# Patient Record
Sex: Male | Born: 1950 | Race: White | Hispanic: No | Marital: Married
Health system: Southern US, Community
[De-identification: ages and names within clinical notes are randomized; demographics above are authoritative.]

---

## 2006-09-10 ENCOUNTER — Encounter: Payer: Self-pay | Admitting: Cardiology

## 2006-09-11 ENCOUNTER — Ambulatory Visit: Payer: Self-pay | Admitting: Cardiology

## 2006-09-11 ENCOUNTER — Inpatient Hospital Stay (HOSPITAL_COMMUNITY): Admission: AD | Admit: 2006-09-11 | Discharge: 2006-09-13 | Payer: Self-pay | Admitting: Cardiovascular Disease

## 2006-10-01 ENCOUNTER — Ambulatory Visit: Payer: Self-pay | Admitting: Cardiovascular Disease

## 2006-12-30 ENCOUNTER — Ambulatory Visit: Payer: Self-pay

## 2006-12-30 ENCOUNTER — Encounter: Payer: Self-pay | Admitting: Cardiovascular Disease

## 2006-12-30 ENCOUNTER — Ambulatory Visit: Payer: Self-pay | Admitting: Cardiovascular Disease

## 2006-12-30 LAB — CONVERTED CEMR LAB
Bilirubin, Direct: 0.1 mg/dL (ref 0.0–0.3)
HDL: 28.2 mg/dL — ABNORMAL LOW (ref >39.0)
LDL Cholesterol: 65 mg/dL (ref 0–99)
Triglycerides: 96 mg/dL (ref 0–149)
VLDL: 19 mg/dL (ref 0–40)

## 2007-01-02 ENCOUNTER — Ambulatory Visit: Payer: Self-pay | Admitting: Cardiovascular Disease

## 2008-06-30 IMAGING — CR DG CHEST 1V PORT
1 series · 1 of 1 positions shown · non-contrast
Comparison: 09/10/06.

CLINICAL DATA: 56-year-old, CHF.  
 PORTABLE CHEST - 1 VIEW:

[view not recorded]
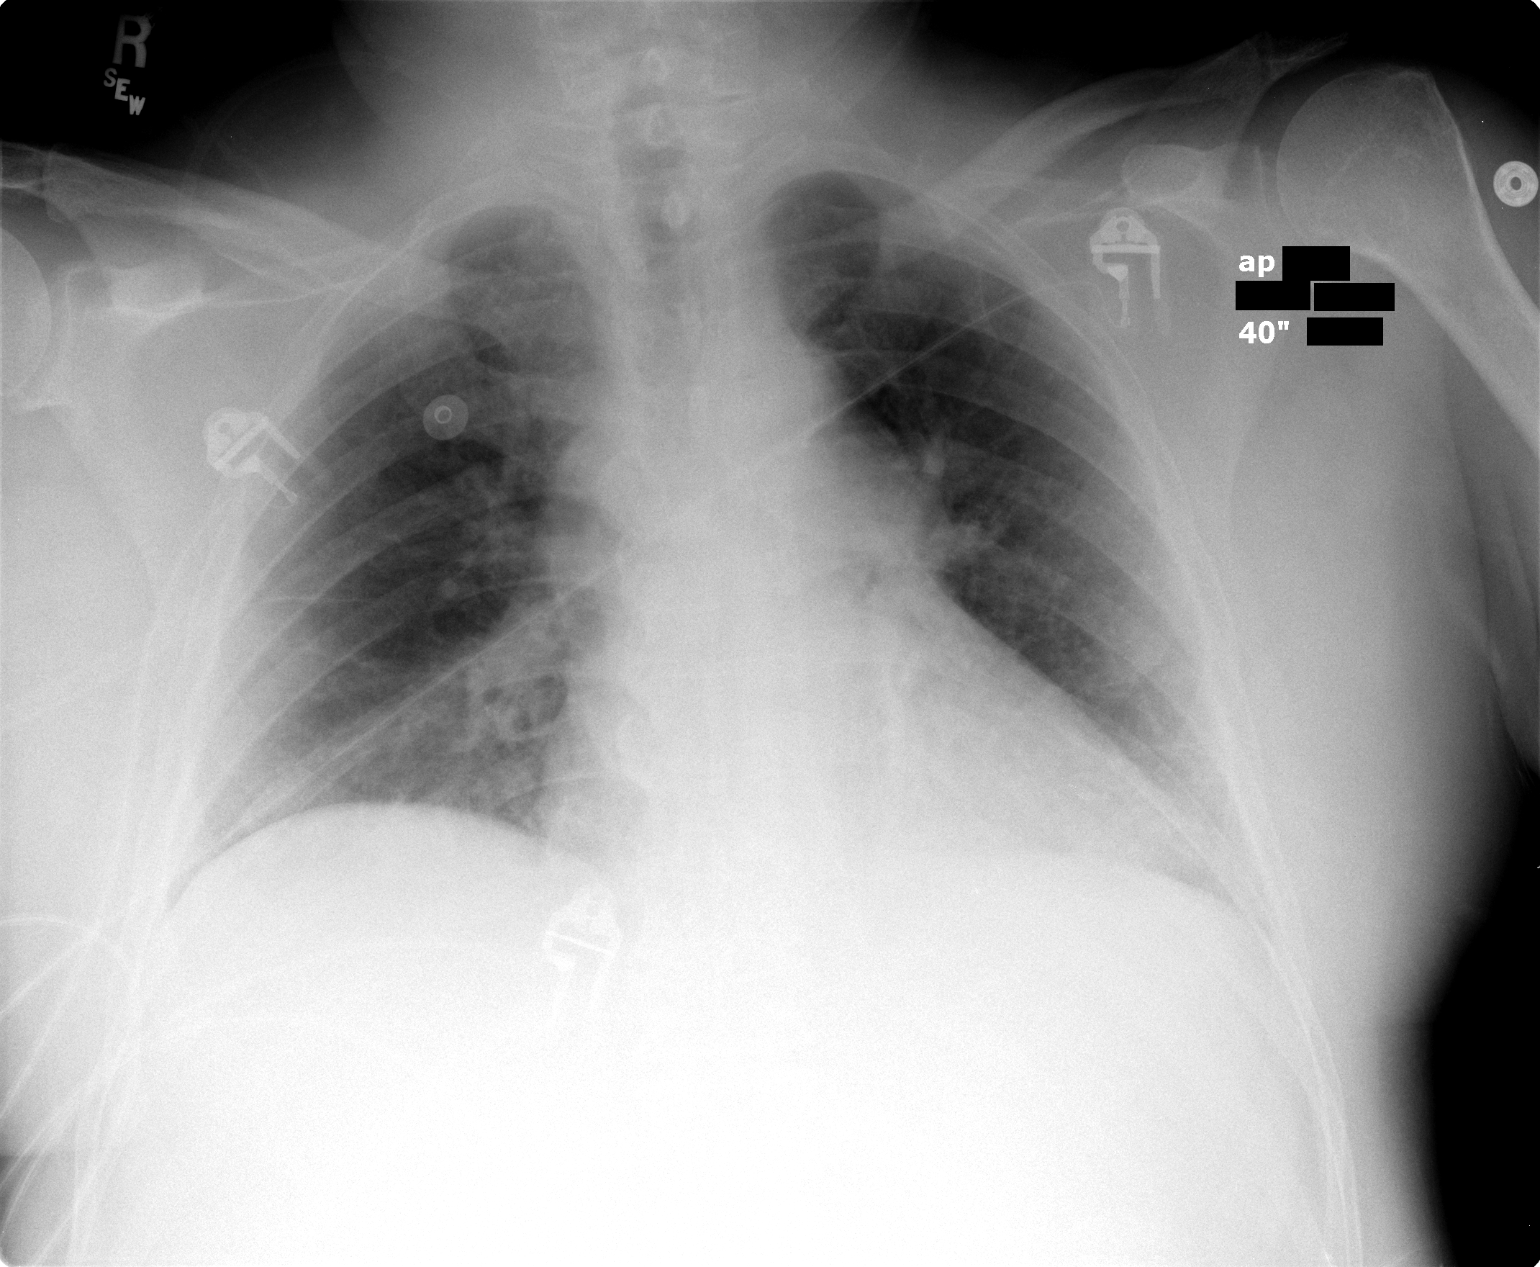

[1 of 1 positions shown; findings below may reference images not displayed]

FINDINGS: Heart remains enlarged.  Mild vascular congestion, but no pulmonary edema.  Prominent pulmonary hila.  Streaky bibasilar atelectasis.
IMPRESSION: Overall stable chest x-ray.  Slight increase in bibasilar atelectasis.

## 2009-08-23 DEATH — deceased

## 2010-11-07 NOTE — Assessment & Plan Note (Signed)
[facility]                            CARDIOLOGY OFFICE NOTE   NAME:Luis Cook                        MRN:          [id number]  DATE:01/02/2007                            DOB:          Jul 27, 1950    Luis Cook was seen in followup at the [facility] Cardiology office on  January 02, 2007.  He is a 60 year old gentleman with ischemic heart  disease.  He presented in March with a large inferior wall MI.  His  presentation was late in the course and despite being treated with  primary PCI, he has sustained significant LV dysfunction.  His EF was  recently evaluated by echocardiography and was 25% to 35%.  His entire  inferior wall was akinetic and there was severe hypokinesis of the  anteroseptum as well.  There was also severe hypokinesis of the apex.  Pulmonary artery pressure estimates were mildly elevated as well.  Mr.  Cook does mention that he has developed a cough.  His cough is worse  when he is outside but he has noticed a chronic cough since his  hospitalization in March.   From a symptomatic standpoint he is doing relatively well.  He has not  had any chest discomfort.  His exercise tolerance is diminished from the  time prior to his infarct; but however, he is still able work in the  yard and perform routine daily activities.  Unfortunately he continues  to smoke cigarettes.  His nausea has cleared up and he has had no  lightheadedness, syncope or other complaints.  He denies orthopnea or  PND.  He has had no edema.   MEDICATIONS:  Include:  1. Aspirin 81 mg daily.  2. Plavix 75 mg daily.  3. Lipitor 80 mg at bedtime.  4. Lisinopril 20 mg daily.  5. Glipizide 10 mg daily.  6. Citalopram 40 mg daily.  7. Prilosec 20 mg daily.  8. Mirtazapine 15 mg 2 at bedtime.  9. NPH insulin.  10.Regular insulin.  11.Metformin 1 gram in the morning and 500 mg in the evening.  12.Coreg 25 mg twice daily.   ALLERGIES:  Include CODEINE and  MORPHINE.   PHYSICAL EXAMINATION:  He is alert and oriented and in no acute  distress.  Weight is 199 pounds.  Blood pressure is 130/60.  Heart rate is 100.  Respiratory rate 16.  HEENT:  Normal.  NECK:  Normal carotid upstrokes without bruits.  Jugular venous pressure  is normal.  LUNGS:  Clear to auscultation bilaterally.  HEART:  Distant heart sounds.  Regular rate and rhythm, no murmurs or  gallops.  ABDOMEN:  Soft, nontender.  No organomegaly.  EXTREMITIES:  No clubbing, cyanosis or edema. Peripheral pulses are 2+  and equal throughout.   Lipids reviewed, total cholesterol is 112, HDL is 28, LDL 65, LFTs are  normal.   ASSESSMENT:  Luis Cook is currently stable from a cardiovascular  standpoint.  His cardiac problems are as follows:  1. Severe ischemic cardiomyopathy.  He should continue on his current      therapy.  I am  going to change his ACE inhibitor to an angiotensin      receptor blocker in the setting of his cough.  His lisinopril will      be discontinued and he will be started on Atacand 8 mg daily. He      should maintain on aspirin, clopidogrel, statin therapy and      treatment of his diabetes.  He is on a goal dose of Coreg.  I have      discussed implantable cardioverter-defibrillator therapy for      primary prevention in setting of his ischemic cardiomyopathy.  I      discussed this at length today with Luis Cook and his wife and      they would like to proceed with an evaluation.  They have some      concerns about cost issues and he has been seen at the [place] as well.      I have asked that he see either Dr. Graciela Husbands or Dr. Ladona Ridgel in      consultation and we can further work out whether his device can be      implanted here or whether he will need to have this done through      the [facility].  2. Dyslipidemia. LDL is at goal.  HDL remains low.  We will continue      with high dose Lipitor.  3. Diabetes.  Care per [name]' primary physician.  4.  Followup.  I would like to see [name] back in 3 months or      sooner if any new problems arise.  He was encouraged to discontinue      cigarettes, although he really has no desire to stop smoking.  He      was encouraged regarding increasing activity level and initiating      regular exercise.  He will notify me if he has any problems.     Veverly Fells. Excell Seltzer, MD  Electronically Signed    MDC/MedQ  DD: 01/02/2007  DT: 01/03/2007  Job #: [id number]
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
## 2010-11-10 NOTE — Assessment & Plan Note (Signed)
[facility]                            CARDIOLOGY OFFICE NOTE   NAME:Plaskett, HENERY                        MRN:          [id number]  DATE:10/01/2006                            DOB:          [birth year]    Shylo Dillenbeck is a 60 year old gentleman who presents in hospital follow-  up after a large inferior wall myocardial infarction back in mid-March  of this year.  Mr. Grill presented late into the course of an inferior  MI with residual chest pain and ST elevation.  Despite his late  presentation, with his residual symptoms, I elected to proceed with  emergency cardiac catheterization.  His catheterization demonstrated  nonobstructive disease in the LAD and left circumflex.  He had an  occluded proximal right coronary artery, which was treated with  thrombectomy, PTCA and stenting.  There was distal embolization of  plaque and no re-flow phenomenon with the coronary intervention.  At  the conclusion of the procedure there was TIMI-2 flow in the vessel.  Left ventriculogram demonstrated a large area of inferior wall akinesis  consistent with a sizeable inferior wall infarction.  The patient  actually did fairly well despite RV involvement of his inferior MI.  He  was discharged on March 21 and has done relatively well since discharge  home.  His main complaint is nausea with a few episodes of vomiting.  He  has had no chest pain, dyspnea, edema, lightheadedness, syncope or other  complaints.   CURRENT MEDICATIONS:  1. Aspirin 81 mg daily.  2. Plavix 75 mg daily.  3. Lipitor 80 mg at bedtime.  4. Coreg 6.25 mg twice daily.  5. Lisinopril 2.5 mg daily.  6. Metformin 500 mg twice daily.  7. Glipizide 10 mg daily.  8. Citalopram 40 mg daily.  9. Prilosec OTC 20 mg daily.  10.Mirtazapine 15 mg at bedtime.  11.NPH insulin and regular insulin for sliding scale as needed.   ALLERGIES:  CODEINE and MORPHINE.   PHYSICAL EXAMINATION:  GENERAL:  The  patient is alert and oriented.  He  is in no acute distress.  VITAL SIGNS:  Blood pressure is 108/72, heart rate is 106, respiratory  rate is 16, weight is 192 pounds.  HEENT:  Normal.  NECK:  Normal carotid upstrokes without bruits.  Jugular venous pressure  is normal.  LUNGS:  Clear to auscultation bilaterally.  CARDIAC:  Mild tachycardia.  No murmurs or gallops.  ABDOMEN:  Soft, nontender.  No organomegaly.  EXTREMITIES:  There is no clubbing, cyanosis, or edema.  Peripheral  pulses are 2+ and equal throughout.   EKG demonstrates normal sinus rhythm with an age-indeterminate inferior  wall infarction.   ASSESSMENT:  Mr. Rosero is doing relatively well from a cardiac  standpoint after a large inferior wall myocardial infarction with  involvement of the right ventricle.  His nausea may be related to one of  his medications, although he is on multiple medicines and it is  difficult to know which is the culprit.  His current cardiac problems  are as follows:   1. Coronary artery disease,  status post myocardial infarction.  I      would like to continue him on aspirin and clopidogrel, although it      would be acceptable for him to discontinue his clopidogrel after 30      days as it is possible that this is causing his nausea.  He was not      treated with a drug-eluting stent and in that setting,      discontinuing this medication would be reasonable.  I would like to      increase his Coreg to 12.5 mg twice daily as he continues to have      tachycardia.  His tachycardia may be related to his high caffeine      intake.  He drinks a pot of coffee daily.  This is down from his      previous consumption of 4-5 pots of coffee daily.  He should      continue on his current dose of lisinopril as his blood pressure      may not tolerate an increase in both Coreg and lisinopril today.      He should also continue on aspirin lifelong.  I would like to see      him back in approximately  10 weeks for follow-up.  2. Type 2 diabetes.  He will continue on insulin and metformin as      managed by his primary care physician.  3. Tobacco abuse.  He was a previous 2-1/2 pack per day smoker but has      cut back to one-half pack of cigarettes daily.  I encouraged him to      continue to work on this as smoking cessation would clearly be      beneficial in the setting of his coronary artery disease and      myocardial infarction.  4. Dyslipidemia.  He is currently on Lipitor 80 mg.  He should follow      up with his LFTs at the time of his return visit.     Veverly Fells. Excell Seltzer, MD  Electronically Signed    MDC/MedQ  DD: 10/01/2006  DT: 10/02/2006  Job #: [id number]

## 2010-11-10 NOTE — Cardiovascular Report (Signed)
NAMEJASRAJ, LAPPE NO.:  [id number]   MEDICAL RECORD NO.:  [id number]          PATIENT TYPE:  OIB   LOCATION:  2807                         FACILITY:  [facility]   PHYSICIAN:  Veverly Fells. Excell Seltzer, MD  DATE OF BIRTH:  [birth year]   DATE OF PROCEDURE:  09/10/2006  DATE OF DISCHARGE:                            CARDIAC CATHETERIZATION   PROCEDURES:  1. Left heart catheterization.  2. Selective coronary angiography.  3. Left ventricular angiography.  4. Percutaneous transluminal cardiac angioplasty, stenting, and      thrombectomy of the right coronary artery.   INDICATIONS:  Luis Cook is a 60 year old gentleman with diabetes and  hypertension who has no prior history of coronary artery disease.  He  presented with several days of intermittent chest pain that became  constant last night.  His pain persisted throughout the morning, and he  went to Urgent Care was found to have inferior ST-segment elevation.  He  was sent emergently for cardiac catheterization in the setting of his  acute inferior MI.   Risks and indications of the procedure were reviewed with the patient.  Informed consent was obtained.  The right groin was prepped, draped and  anesthetized with 1% lidocaine.  A 6-French sheath was placed in the  right femoral artery, and a 6-French sheath was placed in the right  femoral vein using the modified Seldinger technique.  Diagnostic views  of the left and right coronary arteries were taken.  The right coronary  artery was found to be occluded.  Angiomax was used for anticoagulation.  A JR-4 guiding catheter was used to selectively engage the vessel.  Clopidogrel 300 mg was given at the beginning of the case.  A Cougar  guidewire was easily passed beyond the occlusion and into the distal  vessel.  A Fetch aspiration catheter was then used to try to aspirate  thrombus at the beginning of the procedure as there appeared to be a  large thrombus burden.  The  aspiration catheter would not pass beyond  the proximal vessel.  At that point a 2.5 x of 15 mm Maverick balloon  was inserted, and multiple inflations were made throughout the proximal  and mid vessel up to 6 atmospheres.  After several balloon inflations,  flow was restored in the vessel, but the flow was only TIMI-1.  Following that, a 3.0 x 20 mm balloon was passed, and there were again  multiple inflations done.  There was still almost no flow in the vessel,  and intracoronary adenosine was given.  Following adenosine, flow was  restored.  There appeared to be dissection or possibly just a great deal  of thrombus in the area of lesion.  A 3.5 x 32 mm Liberte stent was  deployed at 16 atmospheres.  Following stenting, the vessel was occluded  in its distal portion, and there was likely some plaque or thrombus  embolization.  Intracoronary adenosine was given again, and the proximal  portion of the vessel was stented with a 3.5 x 28 mm Liberte stent which  was deployed at 16 atmospheres to overlap the  first stent.  This covered  the entire area of disease and thrombus.  Following this, there was  restoration of flow with TIMI-2 flow throughout the vessel.  The PDA,  posterior AV segment and posterolateral branches appeared to be somewhat  atretic, but there was fairly good flow in those vessels.  The stented  segment was then postdilated with a 4-0 Quantum Maverick noncompliant  balloon up to 20 atmospheres, and there was no flow beyond the stented  segment following postdilatation.  Intracoronary adenosine was again  given, and flow was once again restored.  At the conclusion of the  procedure, there was TIMI-2 flow in the vessel.  There was a great deal  of downstream disease that may been a combination of thrombus and  vasospasm.  At that point, I felt to Mr. Norem had received the full  benefit of intervention and likely there was not much more to gain by  any further  manipulation.   A pigtail catheter was inserted in the left ventricle, and pressures  were recorded.  The patient had extremely high LV filling pressures.  A  small-volume  left ventriculogram was performed with 21 mL of contrast.  This demonstrated a large area of inferior wall akinesis.  A pullback  across the aortic valve was performed, and the patient was transferred  to the CCU at the conclusion of the case.   FINDINGS:  Aortic pressure 96/72 with a mean of 84, left ventricular  pressure 97/36 with an end-diastolic pressure of 40.   Left main coronary artery is short.  There is no significant disease.   The LAD is a large-caliber vessel that courses down to the LV apex.  It  gives off a medium-size first diagonal branch and a small second  diagonal branch. At the area of the first septal perforator and first  diagonal branch, there is an area of 40% stenosis in the mid LAD, beyond  that there is an area of 25% stenosis.  There is no high-grade disease  in the LAD or diagonals.   Left circumflex is large caliber.  It gives off a large first OM branch.  Just beyond the first OM branch in the mid circumflex, there is an area  of 40% stenosis.  There is a second OM branch that bifurcates into twin  vessels.  The true AV groove circumflex is fairly small.  There are  minor luminal irregularities throughout the circumflex but no  significant high-grade disease.   The right coronary artery is totally occluded at its proximal portion  with a large thrombus burden.   Left ventriculogram showed large area of inferolateral akinesis with an  estimated LVEF of 35%.  There is no significant mitral regurgitation.   ASSESSMENT:  1. 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  Mr. Guice likely has presented late into the      course of his event.  2. 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 3. Moderate systolic  left ventricular dysfunction with a large      inferolateral area of akinesis.  4. Severe elevation of left ventricular end-diastolic pressure.   PLAN:  As detailed above, there was successful PCI of the right coronary  artery but only TIMI-2 flow at the conclusion of the procedure.  I  suspect this is secondary to a late presentation as well as some  downstream embolization of thrombus.  We will  continue Mr. Starace on  Integrilin for 18 hours.  His Angiomax was discontinued at the  completion of the procedure.  He has been given Plavix load.  He will  continue to receive standard post MI care as well.      Veverly Fells. Excell Seltzer, MD  Electronically Signed     MDC/MEDQ  D:  09/10/2006  T:  09/10/2006  Job:  [id number]

## 2015-08-05 ENCOUNTER — Telehealth: Payer: Self-pay

## 2015-08-05 NOTE — Telephone Encounter (Signed)
Opened in error
